# Patient Record
Sex: Female | Born: 1955 | Race: Black or African American | Hispanic: No | Marital: Single | Smoking: Never smoker
Health system: Southern US, Community
[De-identification: ages and names within clinical notes are randomized; demographics above are authoritative.]

## PROBLEM LIST (undated history)

## (undated) DIAGNOSIS — D649 Anemia, unspecified: Secondary | ICD-10-CM

## (undated) DIAGNOSIS — E119 Type 2 diabetes mellitus without complications: Secondary | ICD-10-CM

## (undated) DIAGNOSIS — M1990 Unspecified osteoarthritis, unspecified site: Secondary | ICD-10-CM

## (undated) DIAGNOSIS — I10 Essential (primary) hypertension: Secondary | ICD-10-CM

## (undated) HISTORY — DX: Type 2 diabetes mellitus without complications: E11.9

## (undated) HISTORY — DX: Unspecified osteoarthritis, unspecified site: M19.90

## (undated) HISTORY — PX: TUBAL LIGATION: SHX77

---

## 1994-05-01 HISTORY — PX: DILATION AND CURETTAGE OF UTERUS: SHX78

## 2001-06-06 ENCOUNTER — Other Ambulatory Visit: Admission: RE | Admit: 2001-06-06 | Discharge: 2001-06-06 | Payer: Self-pay | Admitting: *Deleted

## 2008-07-10 ENCOUNTER — Observation Stay (HOSPITAL_COMMUNITY): Admission: EM | Admit: 2008-07-10 | Discharge: 2008-07-10 | Payer: Self-pay | Admitting: Emergency Medicine

## 2008-07-24 ENCOUNTER — Ambulatory Visit: Payer: Self-pay | Admitting: Obstetrics & Gynecology

## 2008-07-24 ENCOUNTER — Encounter: Payer: Self-pay | Admitting: Obstetrics and Gynecology

## 2008-07-24 LAB — CONVERTED CEMR LAB
FSH: 8.1 milliintl units/mL
Hemoglobin: 9 g/dL — ABNORMAL LOW (ref 12.0–15.0)
RBC: 5.02 M/uL (ref 3.87–5.11)
RDW: 30.5 % — ABNORMAL HIGH (ref 11.5–15.5)

## 2008-07-28 ENCOUNTER — Encounter: Admission: RE | Admit: 2008-07-28 | Discharge: 2008-07-28 | Payer: Self-pay | Admitting: Family Medicine

## 2008-07-29 ENCOUNTER — Ambulatory Visit (HOSPITAL_COMMUNITY): Admission: RE | Admit: 2008-07-29 | Discharge: 2008-07-29 | Payer: Self-pay | Admitting: Family Medicine

## 2008-09-04 ENCOUNTER — Encounter: Payer: Self-pay | Admitting: Obstetrics & Gynecology

## 2008-09-04 ENCOUNTER — Ambulatory Visit: Payer: Self-pay | Admitting: Obstetrics & Gynecology

## 2008-09-04 LAB — CONVERTED CEMR LAB: TSH: 1.928 microintl units/mL (ref 0.350–4.500)

## 2008-10-04 ENCOUNTER — Ambulatory Visit: Payer: Self-pay | Admitting: Obstetrics & Gynecology

## 2008-10-06 ENCOUNTER — Ambulatory Visit (HOSPITAL_COMMUNITY): Admission: RE | Admit: 2008-10-06 | Discharge: 2008-10-06 | Payer: Self-pay | Admitting: Obstetrics & Gynecology

## 2008-10-06 ENCOUNTER — Encounter: Payer: Self-pay | Admitting: Obstetrics & Gynecology

## 2008-11-06 ENCOUNTER — Ambulatory Visit: Payer: Self-pay | Admitting: Obstetrics & Gynecology

## 2008-11-06 ENCOUNTER — Encounter: Payer: Self-pay | Admitting: Obstetrics & Gynecology

## 2008-11-06 LAB — CONVERTED CEMR LAB: Yeast Wet Prep HPF POC: NONE SEEN

## 2010-03-22 IMAGING — CT CT ABDOMEN W/ CM
2 of 5 series · 15 of 46 positions shown, 17 images · IV contrast (APPLIED)
Comparison: None.

CT ABDOMEN

CLINICAL DATA: 52-year-old female with fatigue, weakness, anemia.

CT ABDOMEN AND PELVIS WITH CONTRAST
TECHNIQUE: Multidetector CT imaging of the abdomen and pelvis was
performed using the standard protocol following bolus
administration of intravenous contrast.
Contrast: 100 ml Zmnipaque-JZZ.

[Series 3: abd/pelv with 5.0 b31f st · axial · 0.76mm/px · z∈[+922,+1282]mm · 12 of 82 slices shown, 14 images]
[im 5/82  soft-tissue]
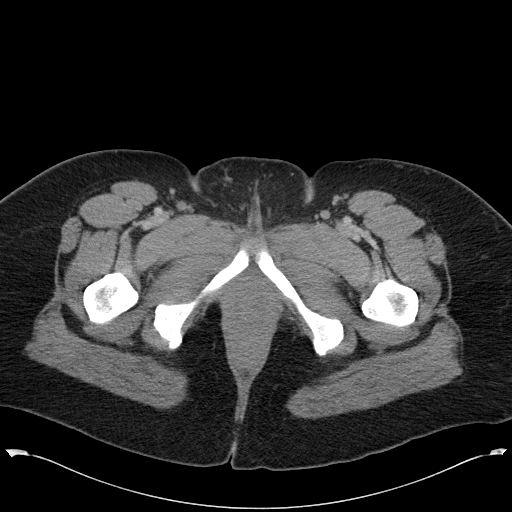
[im 5/82  bone]
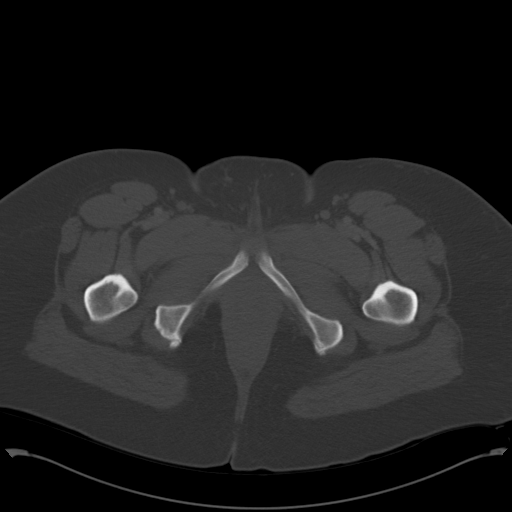
[im 14/82  soft-tissue]
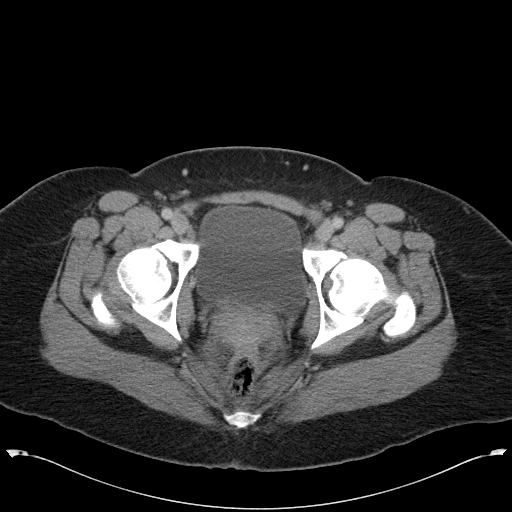
[im 19/82  soft-tissue]
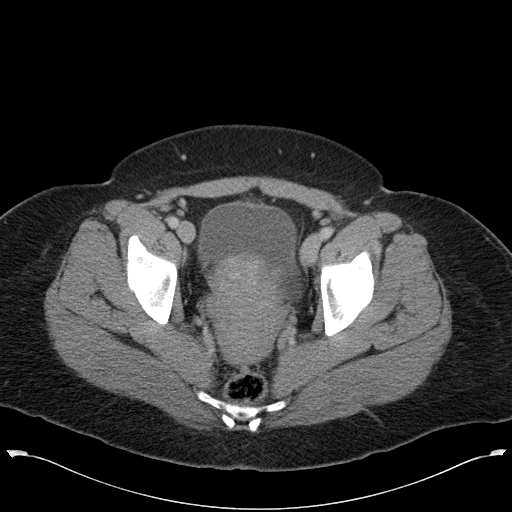
[im 23/82  soft-tissue]
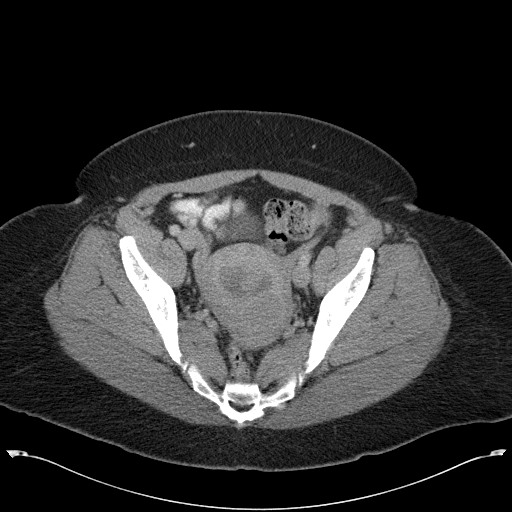
[im 32/82  soft-tissue]
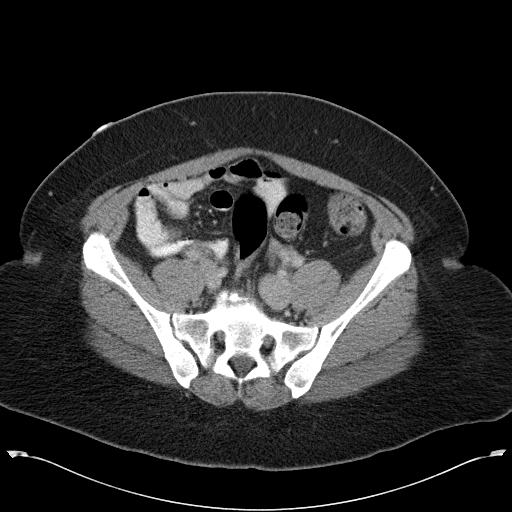
[im 37/82  soft-tissue]
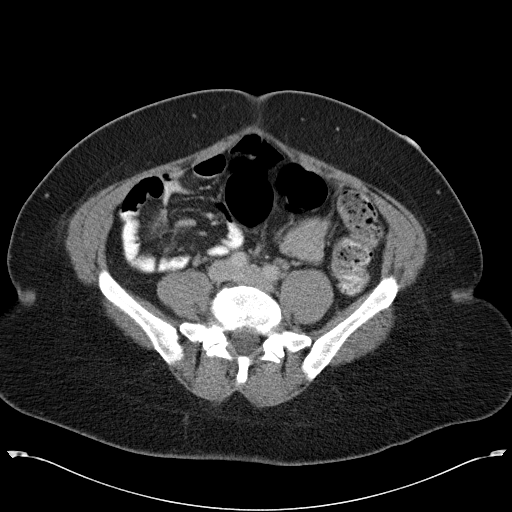
[im 46/82  soft-tissue]
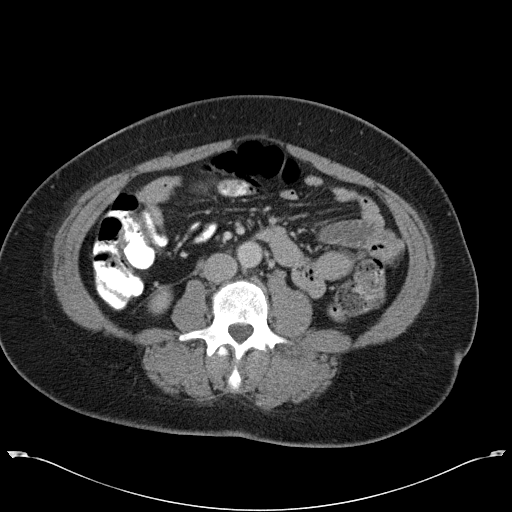
[im 50/82  soft-tissue]
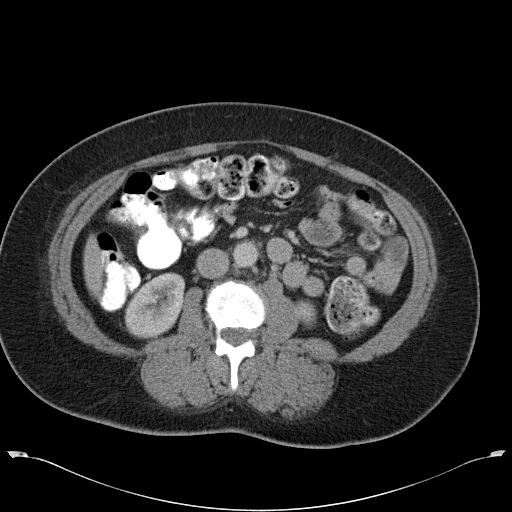
[im 59/82  soft-tissue]
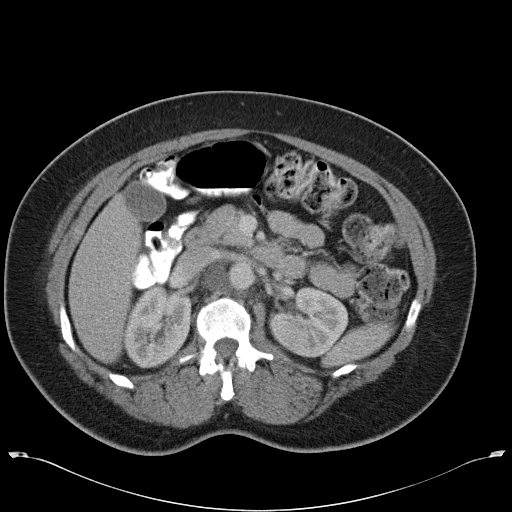
[im 59/82  bone]
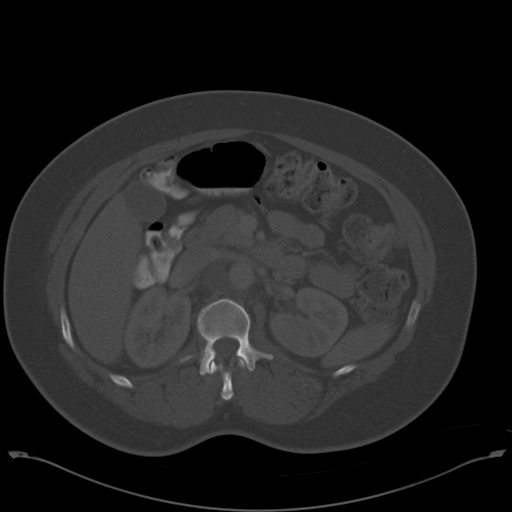
[im 64/82  soft-tissue]
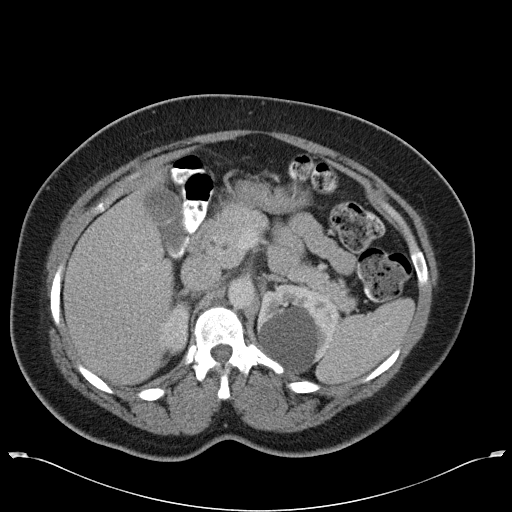
[im 68/82  soft-tissue]
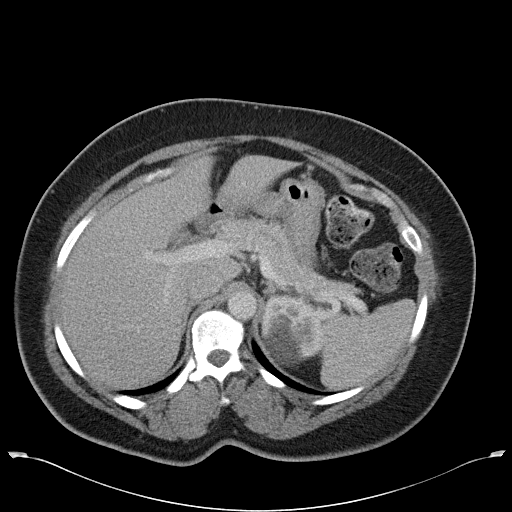
[im 77/82  soft-tissue]
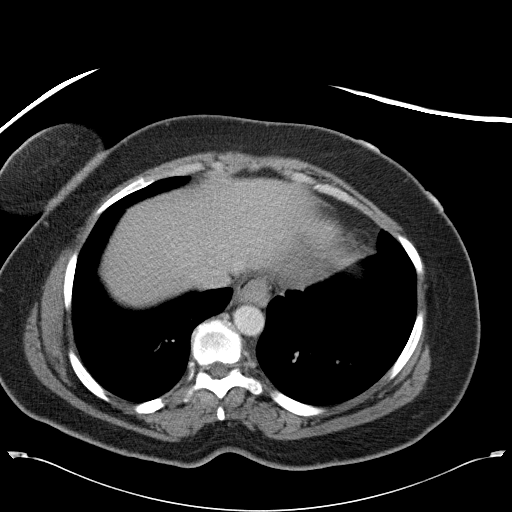

[Series 602: coronal · coronal · 0.80mm/px · 3 of 83 slices shown]
[im 28/83  soft-tissue]
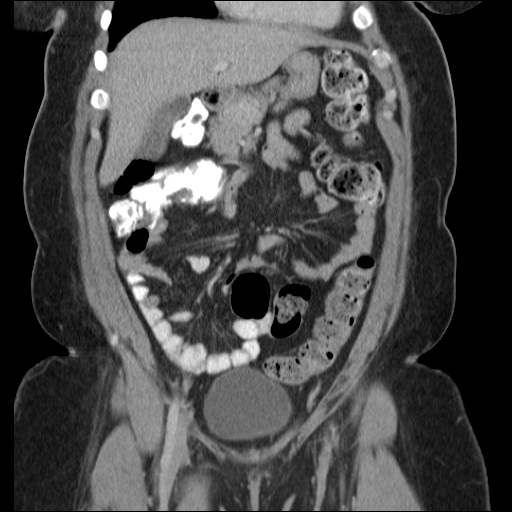
[im 37/83  soft-tissue]
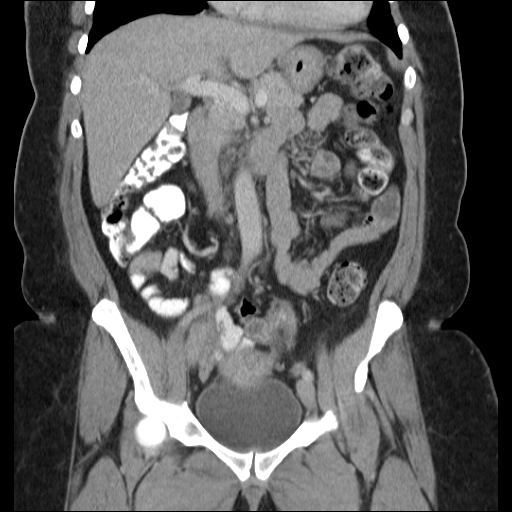
[im 46/83  soft-tissue]
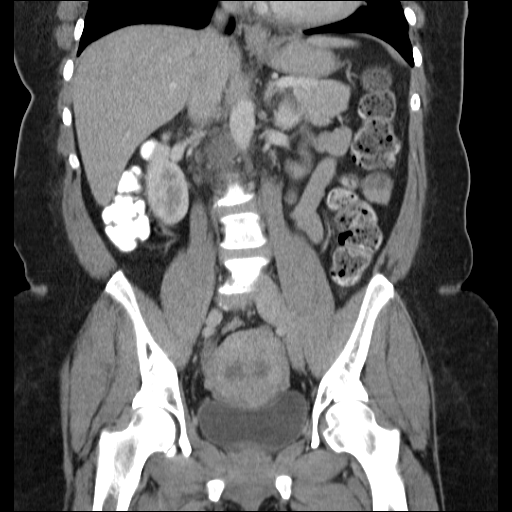

[15 of 46 positions shown; findings below may reference images not displayed]

FINDINGS: Minor atelectasis at the lung bases.  Scoliosis.  L5-S1
chronic disc degeneration. No acute osseous abnormality identified.
No pleural or pericardial effusion.  The liver, gallbladder,
spleen, pancreas, adrenal glands, right kidney, stomach, duodenum,
and proximal small bowel loops are within normal limits.  The left
kidney is normal aside from a large low density lesion measuring 5
cm in diameter involving the upper pole, but with densitometry most
compatible with a simple cyst.  The portal venous system is within
normal limits.  The major abdominal arterial structures are within
normal limits (incidental duplicated main right renal artery), but
there is retroperitoneal lymphadenopathy at the level of the main
right renal artery which elevates the inferior vena cava.
Individual nodes which appear necrotic measure up to 19 mm in short
axis.  No other lymphadenopathy identified in the abdomen.  No
associated paraspinal inflammatory changes at that level.  No
dilated bowel.  Visualized distal small bowel loops are within
normal limits.  The cecum is on a lax mesentery and is rotated into
the right upper quadrant.  The appendix and terminal ileum are
normal.  The remaining colon in the abdomen is within normal limits
aside from retained stool throughout.
IMPRESSION: 1.  Necrotic appearing, isolated retroperitoneal lymphadenopathy at
the level of the right renal artery.  Individual nodes measure up
to 19 mm in short axis.  No associated inflammatory changes or
suspicious masses in the adjacent viscera.  Necrotic nodes are most
often seen in the setting of infection (including atypical and
mycobacterial/TB) and metastatic disease.
2.  Lax cecal mesentery with rotation of the cecum in the right
upper quadrant, likely incidental.
3.  Simple appearing left renal cyst measuring 5 mm in size.
4.  See pelvis findings below.

CT PELVIS
FINDINGS: No pelvic free fluid.  The uterus is enlarged, and there
is a subtle roughly 4 cm mass along the dorsal myometrium.  There
is also subtle increased density in the region of the endometrial
canal at the fundus.  The right adnexa is within normal limits.
The left adnexa appears to be located superior to the uterine
fundus and is remarkable for a 16 mm diameter cystic lesion with
surrounding enhancement.  No pelvic sidewall lymphadenopathy.
Major pelvic arterial structures are within normal limits.
Redundant sigmoid colon extends into the abdomen.  Otherwise,
visualized distal large and small bowel are within normal limits.
The bladder is unremarkable. No acute osseous abnormality
identified.
IMPRESSION: 1.  Uterine enlargement which may be related to a dorsal intramural
fibroid measuring roughly 4 cm in diameter.  Indistinct
hyperdensity in the endometrial canal at the level of the fundus,
endometrial carcinoma not excluded.
2.  Left adnexal cystic lesion measuring 16 mm.  The left ovary
appears located cephalad to the uterine fundus.
3.  Pelvic ultrasound may characterize the abnormalities in #1 and
#2 further.
4.  Redundant sigmoid colon.

## 2010-05-22 ENCOUNTER — Encounter: Payer: Self-pay | Admitting: Family Medicine

## 2010-08-08 LAB — BASIC METABOLIC PANEL
BUN: 8 mg/dL (ref 6–23)
Chloride: 104 mEq/L (ref 96–112)
Creatinine, Ser: 0.72 mg/dL (ref 0.4–1.2)
Glucose, Bld: 77 mg/dL (ref 70–99)
Potassium: 4.1 mEq/L (ref 3.5–5.1)

## 2010-08-08 LAB — CBC
HCT: 39.5 % (ref 36.0–46.0)
MCHC: 32.4 g/dL (ref 30.0–36.0)
MCV: 75.4 fL — ABNORMAL LOW (ref 78.0–100.0)
Platelets: 240 10*3/uL (ref 150–400)
RDW: 18.4 % — ABNORMAL HIGH (ref 11.5–15.5)
WBC: 4.3 10*3/uL (ref 4.0–10.5)

## 2010-08-09 LAB — GLUCOSE, CAPILLARY: Glucose-Capillary: 92 mg/dL (ref 70–99)

## 2010-08-11 LAB — TSH: TSH: 2.806 u[IU]/mL (ref 0.350–4.500)

## 2010-08-11 LAB — COMPREHENSIVE METABOLIC PANEL
ALT: 55 U/L — ABNORMAL HIGH (ref 0–35)
AST: 68 U/L — ABNORMAL HIGH (ref 0–37)
Albumin: 3.7 g/dL (ref 3.5–5.2)
Alkaline Phosphatase: 61 U/L (ref 39–117)
CO2: 26 mEq/L (ref 19–32)
Chloride: 107 mEq/L (ref 96–112)
GFR calc Af Amer: >60 mL/min (ref >60)
GFR calc non Af Amer: >60 mL/min (ref >60)
Potassium: 3.5 mEq/L (ref 3.5–5.1)
Total Bilirubin: 0.5 mg/dL (ref 0.3–1.2)

## 2010-08-11 LAB — URINALYSIS, ROUTINE W REFLEX MICROSCOPIC
Glucose, UA: NEGATIVE mg/dL
Hgb urine dipstick: NEGATIVE
Ketones, ur: NEGATIVE mg/dL
Protein, ur: NEGATIVE mg/dL
Urobilinogen, UA: 1 mg/dL (ref 0.0–1.0)

## 2010-08-11 LAB — IRON AND TIBC
Saturation Ratios: 4 % — ABNORMAL LOW (ref 20–55)
TIBC: 414 ug/dL (ref 250–470)
UIBC: 397 ug/dL

## 2010-08-11 LAB — DIFFERENTIAL
Band Neutrophils: 0 % (ref 0–10)
Blasts: 0 %
Lymphocytes Relative: 34 % (ref 12–46)
Lymphs Abs: 2 10*3/uL (ref 0.7–4.0)
Monocytes Absolute: 0.3 10*3/uL (ref 0.1–1.0)
Monocytes Relative: 5 % (ref 3–12)
Neutrophils Relative %: 59 % (ref 43–77)
Promyelocytes Absolute: 0 %
nRBC: 0 /100 WBC

## 2010-08-11 LAB — URINE CULTURE: Colony Count: >=100000

## 2010-08-11 LAB — LIPID PANEL
Cholesterol: 169 mg/dL (ref 0–200)
HDL: 32 mg/dL — ABNORMAL LOW (ref >39)
LDL Cholesterol: 118 mg/dL — ABNORMAL HIGH (ref 0–99)

## 2010-08-11 LAB — POCT I-STAT, CHEM 8
Calcium, Ion: 0.98 mmol/L — ABNORMAL LOW (ref 1.12–1.32)
Chloride: 108 mEq/L (ref 96–112)
Glucose, Bld: 101 mg/dL — ABNORMAL HIGH (ref 70–99)
HCT: 27 % — ABNORMAL LOW (ref 36.0–46.0)
Hemoglobin: 9.2 g/dL — ABNORMAL LOW (ref 12.0–15.0)
TCO2: 22 mmol/L (ref 0–100)

## 2010-08-11 LAB — CROSSMATCH

## 2010-08-11 LAB — HEMOGLOBIN A1C: Hgb A1c MFr Bld: 6 % (ref 4.6–6.1)

## 2010-08-11 LAB — CBC
HCT: 24.8 % — ABNORMAL LOW (ref 36.0–46.0)
Platelets: 309 10*3/uL (ref 150–400)
RDW: 23.8 % — ABNORMAL HIGH (ref 11.5–15.5)
WBC: 5.8 10*3/uL (ref 4.0–10.5)

## 2010-08-11 LAB — FERRITIN: Ferritin: 2 ng/mL — ABNORMAL LOW (ref 10–291)

## 2010-08-11 LAB — HEMOGLOBIN AND HEMATOCRIT, BLOOD: HCT: 28.3 % — ABNORMAL LOW (ref 36.0–46.0)

## 2010-08-11 LAB — PROTIME-INR: Prothrombin Time: 12.3 seconds (ref 11.6–15.2)

## 2010-08-11 LAB — ABO/RH: ABO/RH(D): O POS

## 2010-08-11 LAB — FOLATE: Folate: >20 ng/mL

## 2010-08-11 LAB — HEMOCCULT GUIAC POC 1CARD (OFFICE): Fecal Occult Bld: NEGATIVE

## 2010-09-13 NOTE — Op Note (Signed)
[name], [name]               ACCOUNT NO.:  [id number]   MEDICAL RECORD NO.:  [id number]          PATIENT TYPE:  WOC   LOCATION:  WOC                          FACILITY:  [facility]   PHYSICIAN:  Allie Bossier, MD        DATE OF BIRTH:  [birth year]   DATE OF PROCEDURE:  10/06/2008  DATE OF DISCHARGE:                               OPERATIVE REPORT   PREOPERATIVE DIAGNOSES:  Dysfunctional uterine bleeding, uterine  fibroids, uterine polyps, obesity, hypertension.   POSTOPERATIVE DIAGNOSES:  Dysfunctional uterine bleeding, uterine  fibroids, uterine polyps, obesity, hypertension.   PROCEDURE:  Hysteroscopy, dilation and curettage, and ThermaChoice  endometrial ablation.   SURGEON:  Allie Bossier, MD   ANESTHESIA:  LMA.  Malen Gauze, MD   COMPLICATIONS:  None.   ESTIMATED BLOOD LOSS:  Minimal.   SPECIMENS:  Uterine curettings including polyps.   DETAILS OF PROCEDURE AND FINDINGS:  The risks, benefits, and  alternatives of surgery were explained, understood, and accepted.  Consents were signed.  All questions were answered.  She was taken to  the operating room.  General anesthesia was applied without  complication.  She was placed in the dorsal lithotomy position.  Her  vagina was prepped and draped in the usual sterile fashion.  Her bladder  was emptied with a Robinson catheter.  Bimanual exam revealed a lumpy 8  weeks size mobile uterus and nonenlarged adnexa.  A weighted speculum  was placed posteriorly and a Deaver anteriorly.  The anterior lip of the  cervix was grasped with single-tooth tenaculum.  Cervix was then gently  and easily dilated to accommodate a diagnostic hysteroscope.  Hysteroscopy was performed.  There was a thickened endometrium noted  throughout the several polypoid areas.  Sharp curettage was done and a  large amount of endometrium was obtained.  A gritty sensation was  appreciated throughout and in the fundus of the uterus on hysteroscopy  confirmed that the  uterine cavity was emptied.  I then proceeded with  the ThermaChoice ablation after the proper tori steps per the protocol.  The uterus was sounded to 9 cm.  The ThermaChoice instrument was placed  in the uterine cavity and the balloon was filled using 30 mL of  solution.  The pressure stabilized at 150 and the cycle was started, 8-  minute cycle completed and the balloon was removed and noted to be  intact.  No  bleeding from the tenaculum or the cervical os.  Net loss of the  distention medium was 10 mL.  She tolerated the procedure well.  She was  taken to the recovery room in stable condition.  The instruments,  sponge, and needle counts were correct.      Allie Bossier, MD  Electronically Signed     MCD/MEDQ  D:  10/06/2008  T:  10/07/2008  Job:  [id number]

## 2010-09-13 NOTE — Group Therapy Note (Signed)
NAMEGAYLIA, [name] NO.:  [id number]   MEDICAL RECORD NO.:  [id number]          PATIENT TYPE:  WOC   LOCATION:  [facility]                   FACILITY:  [facility]   PHYSICIAN:  Allie Bossier, MD        DATE OF BIRTH:  Mar 19, 1956   DATE OF SERVICE:  09/04/2008                                  CLINIC NOTE   [name] is a 55 year old single black gravida 2, para 2 with 2 grown  children.  She comes in here as a follow up from her visit 3 weeks ago.  Initially, she was seen at her internal medicine doctor because of  menorrhagia.  At that time, her hemoglobin was checked and noted to be  7.4.  She was admitted to the hospital and transfused 2 units of blood.  During that day, she had a GYN ultrasound that showed normal adnexa, but  several fibroids one of which measures 4.1 x 3.9 cm.  There were also  thickened areas in the endometrium that were suspicious for polyp.  In  the interim, she was prescribed estradiol and Provera.  However, she did  not take the Provera and only took the estradiol.  However, her bleeding  has stopped since her visit 3 weeks ago and she is ready for her Pap  smear today.   PAST MEDICAL HISTORY:  Fibroid uterus, hypertension, obesity, anemia,  and menometrorrhagia.   PAST SURGICAL HISTORY:  Tubal ligation.   SOCIAL HISTORY:  Negative for tobacco, alcohol, or drug use.   FAMILY HISTORY:  Negative for breast, GYN, and colon malignancies.   ALLERGIES:  No known drug allergies.  No to latex allergies.   REVIEW OF SYSTEMS:  She has been monogamous for the last 6 years.   MEDICATIONS:  1. Iron supposed to be taken 3 times a day which she does not quite do      that.  2. Vitamin C 500 mg daily.  3. Lisinopril 20 mg daily.  4. Claritin daily.  5. Estradiol 1 mg daily (please note, I have advised her not to take      unopposed estrogen and I told her just to discontinue the estradiol      prior to surgery).   PHYSICAL EXAMINATION:  VITAL  SIGNS:  Weight 200 pounds, height 5 feet 2  inches, blood pressure 146/79, pulse 74.  HEENT:  Normal.  HEART:  Regular rate and rhythm.  LUNGS:  Clear to auscultation bilaterally.  ABDOMEN:  Centripetal obesity.  No palpable hepatosplenomegaly.  EXTERNAL GENITALIA:  No lesions.  Cervix, no lesions and no bleeding  today.  Pap smear was obtained.  Bimanual exam reveals a 8-week size  uterus.  The adnexa are not palpable.  Please note that this exam is  limited by her centripetal obesity.   ASSESSMENT AND PLAN:  1. Menometrorrhagia resulting in anemia requiring transfusions.      Polyps and fibroids are present.  I have suggested that we do a D      and C, hysteroscopy, and resection of the polyps followed      immediately by  an endometrial ablation with the ThermaChoice      instrument.  In the meantime, I have done a Pap smear today and      ordered a TSH.  Please note that, I did see acanthosis nigricans on      her inner thighs and I am ordering a random glucose today due to      that finding along with her obesity.  Paperwork is being turned in      today for her surgery.  Risks, benefits, and alternatives of      surgery explained to her and accepted.      Allie Bossier, MD     MCD/MEDQ  D:  09/04/2008  T:  09/04/2008  Job:  [id number]

## 2010-09-13 NOTE — Group Therapy Note (Signed)
NAMESAMAR, VENNEMAN NO.:  [id number]   MEDICAL RECORD NO.:  [id number]          PATIENT TYPE:  WOC   LOCATION:  [facility]                   FACILITY:  [facility]   PHYSICIAN:  Allie Bossier, MD        DATE OF BIRTH:  14-Jul-1955   DATE OF SERVICE:  09/04/2008                                  CLINIC NOTE   [name] is a 55 year old single.  She is single lady who has been  having monthly periods lasting 7 days of very, very heavy periods for  several years.  In fact, she was admitted to the hospital on July 09, 2008 due to significant anemia (her hemoglobin was 7.41 when she showed  up in the doctor's office complaining of heavy periods).  She had an  ultrasound and CT of the abdomen and pelvis which showed several uterine  fibroids, one of which measures 4 cm and come possible endometrial  polyp.      Allie Bossier, MD     MCD/MEDQ  D:  09/04/2008  T:  09/04/2008  Job:  [id number]

## 2010-09-13 NOTE — Group Therapy Note (Signed)
[name], [name] NO.:  [id number]   MEDICAL RECORD NO.:  [id number]          PATIENT TYPE:  WOC   LOCATION:  [facility]                   FACILITY:  [facility]   PHYSICIAN:  Scheryl Darter, MD       DATE OF BIRTH:  [birth year]   DATE OF SERVICE:  07/24/2008                                  CLINIC NOTE   The patient was referred here from her primary care physician for  followup of menorrhagia.  She states that she has had this heavy  bleeding, abnormal periods since her early 47s.  She was recently  hospitalized July 09, 2008, for hemoglobin in the office found to be  7.2.  She was transfused in the hospital and posttransfusion hemoglobin  at that time was 8.4.  Today, she denies any symptoms.  No shortness of  breath, no dizziness, no vision changes, no chest pain.  No syncopal  episodes.  She is currently undergoing her period right now and states  that it is heavy and that she is having some cramping and some abdominal  pain with it.  She is a nonsmoker and she is not on any medications as  far as birth control pills go.  She also states that she is currently  undergoing menopausal symptoms since she was 55 years old, complaining  of hot flashes and also some night sweats.   PHYSICAL EXAMINATION:  VITAL SIGNS:  Temperature was 97; pulse 55; blood  pressure 169/89, rechecked it was 177/83.  Her weight is 200.8 pounds.  She is 5 feet 2 inches.  GENERAL:  She is mildly obese, pleasant, and in no apparent distress.  CARDIOVASCULAR:  Regular rate and rhythm.  No murmurs, rubs, or gallops.  RESPIRATORY:  Clear to auscultation bilaterally.  ABDOMEN:  Soft, nontender, and mildly obese.  No masses palpable.  PELVIC:  The patient declined pelvic exam due to current bleeding.  She  also declined speculum exam.  EXTREMITIES:  No edema.   ASSESSMENT AND PLAN:  This is a 55 year old female with menorrhagia and  fibroids seen on a CT scan done on July 10, 2008.  Plan for  her, given  her symptoms and heavy bleeding:  1. Menorrhagia.  We will obtain a transvaginal ultrasound.  We will      check FSH given the fact that she states that she is undergoing      menopause.  We will also check CBC today to look for changes in her      hemoglobin.  We will start her on Provera 5 mg and also start on      estradiol 1 mg.  She has not had a Pap smear since 1996.  We will      schedule to perform that and follow up this in 2-3 weeks.  She has      a mammogram scheduled for July 28, 2008.  2. Anemia.  Recommend her to continue the ferrous sulfate 325 t.i.d.      We will check CBC today.  3. Hypertension.  Blood pressure is not adequately controlled at this  time.  Today, we added combination p.o.      hydrochlorothiazide and lisinopril 12.5/20.  She is to take this      daily for the next month and be reevaluated by her primary care      physician who can determine whether to increase the dosage.     ______________________________  Nancy Shingles, MD    ______________________________  Scheryl Darter, MD    KL/MEDQ  D:  07/24/2008  T:  07/25/2008  Job:  [id number]   cc:   Ursula Beath, MD

## 2010-09-13 NOTE — H&P (Signed)
NAMEMARIAM, [name] NO.:  [id number]   MEDICAL RECORD NO.:  [id number]          PATIENT TYPE:  EMS   LOCATION:  [facility]                         FACILITY:  [facility]   PHYSICIAN:  Vania Rea, M.D. DATE OF BIRTH:  19-Sep-1955   DATE OF ADMISSION:  07/09/2008  DATE OF DISCHARGE:                              HISTORY & PHYSICAL   PRIORITY ADMISSION HISTORY AND PHYSICAL   PRIMARY CARE PHYSICIAN:  Nancy Rowan, MD.   CHIEF COMPLAINT:  Anemia.   HISTORY OF PRESENT ILLNESS:  This is a 55 year old African American  lady, who has not had a medical followup for some time, who does suffer  with menorrhagia and says sometimes she has to double up on her pads and  menstruation can last from 3 to 5 days but are regular.  She has never  been investigated for a cause of her menorrhagia.  Eventually besides  that she has turned 52, she has a regular medical checkup including make  arrangements for her preventative screening.  The patient saw Dr.  Maryelizabeth Carroll for the first time 3 days ago, had some blood work done,  and was called yesterday and told to present to the emergency room  because of severe anemia.  The patient came to the [facility]  Room and had blood work done, which showed a severe microcytic anemia,  and the Hospitalist Service is called to assist with management.   The patient denies any chest pains or shortness of breath.  She denies  any dyspnea on exertion and says she can walk miles easily without  getting short of breath.  She has not been losing any weight or gaining  weight.  She has no headaches, no orthopnea, no fever, cough, or cold,  no nausea, vomiting, nor diarrhea, no lower extremity edema.   PAST MEDICAL HISTORY:  She has no significant medical problems.   FAMILY HISTORY:  Significant for her mother with hypertension and  diabetes, and multiple siblings with hypertension.   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  She has 2  teenage children.   MEDICATIONS:  None.   ALLERGIES:  No known drug allergies.   REVIEW OF SYSTEMS:  Other than noted above, 10-point review of systems  is negative.   PHYSICAL EXAMINATION:  GENERAL:  A somewhat obese, middle-aged, African  American lady reclining on the stretcher in no acute distress.  VITAL SIGNS:  Her temperature is 97.8, pulse 71, respiration of 20,  blood pressure 191/97, she is saturating at 100% on 2 L.  HEENT:  Pupils are round and equal, mucous membranes pale, anicteric.  NECK:  No cervical lymphadenopathy or thyromegaly, no jugulovenous  distention.  CHEST:  Clear to auscultation bilaterally.  CARDIOVASCULAR SYSTEM:  Regular rhythm without murmur.  ABDOMEN:  Obese, but soft.  She says she is tender in the abdomen.  EXTREMITIES:  Without edema.  She has 2+ dorsalis pedis pulses  bilaterally.  CENTRAL NERVOUS SYSTEM:  Cranial nerves II-XII  are grossly intact, and  she has no focal neurologic deficit.   LABORATORY DATA:  Significant for a white count of 5.8, hemoglobin is  7.4, MCV 58.3, platelets 379.  Complete metabolic panel is completely  normal.  Her BUN is 9, her creatinine is undetectable.  Two-view chest x-  ray shows scoliosis at multiple thoracic levels, but otherwise no acute  abnormalities.  PT and INR are normal.  Stool occult blood is negative.   ASSESSMENT:  1. Severe microcytic anemia probably related to menorrhagia.  2. Nonspecific abdominal pain.  3. Mild obesity.  4. Uncontrolled hypertension.   PLAN:  We will bring this lady in on observation for transfusion and  start some antihypertensives, and she can probably get further workup  for her anemia as an outpatient.  Other plans as per orders.      Vania Rea, M.D.  Electronically Signed     LC/MEDQ  D:  07/10/2008  T:  07/10/2008  Job:  [id number]   cc:   [name], M.D.

## 2010-09-13 NOTE — Discharge Summary (Signed)
NAMEJALONDA, [name] NO.:  [id number]   MEDICAL RECORD NO.:  [id number]          PATIENT TYPE:  OBV   LOCATION:  5123                         FACILITY:  [facility]   PHYSICIAN:  Charlestine Massed, MDDATE OF BIRTH:  1955/12/03   DATE OF ADMISSION:  07/09/2008  DATE OF DISCHARGE:  07/10/2008                               DISCHARGE SUMMARY   PRIMARY CARE PHYSICIAN:  Maryelizabeth Rowan, M.D.   GYNECOLOGIST:  Scheryl Darter, M.D. at [facility]   REASON FOR ADMISSION:  Anemia, sent from doctor's office.   DISCHARGE DIAGNOSES:  1. Blood loss anemia secondary to heavy menorrhagia.  2. Mild obesity.  3. Possible high blood pressure while in the hospital to be followed      up at PMD's office.   DISCHARGE MEDICATIONS:  1. Ferrous sulfate 325 mg p.o. t.i.d.  2. Colace 300 mg p.o. q.h.s.  3. Lisinopril 20 mg p.o. daily.   HOSPITAL COURSE:  1. Anemia of blood loss.  A 55 year old female [name] was sent      by the PMD's office as she was seen there for a regular checkup and      blood tests showed anemia of 7.4.  She was seen in the emergency      room and she was found to have microcytic hypochromic anemia.      History wise she stated that she has heavy menorrhagia requiring      more than 15-20 pads a day which are thoroughly soaked in blood.      So the patient was admitted with anemia of blood loss and given 1      unit of blood transfusion.  A CAT scan of the abdomen and pelvis      was done which reveals uterine enlargement with a dorsal intramural      fibroid measuring 4 cm in diameter and indistinct hyperdensity in      the endometrial canal at the level of the fundus and that      malignancy cannot be excluded.  Also has adnexal cystic lesion 60      mm on the left ovary located cephalad to the uterine fundus.  These      findings were discussed with Dr. Debroah Loop at the [facility] who stated that there is no urgent  consult needed by      gynecology but the patient can follow up with gyn as an outpatient      and an appointment has been set up for the patient to follow up      with gyn as outpatient on March 26 with Dr. Debroah Loop.  The patient's      repeat hemoglobin was 8.7 and the patient is asymptomatic all this      time.  The patient is being discharged back home on the      medications.  2. Hypertension.  Has been having high blood pressure.  I told her      that there could be slightly high blood pressure with anemia and  currently will start the patient on lisinopril 20 mg p.o. daily and      further adjustment can be done by PMD at the office.   DISCHARGE INSTRUCTIONS:  1. To follow up with PMD's office in 1 week.  2. If she develops excessive bleeding to contact her primary doctor      immediately.   FOLLOWUP:  1. Follow up with the PMD's office in 1 week.  2. Follow up with gyn on March 26.      Charlestine Massed, MD  Electronically Signed     UT/MEDQ  D:  07/10/2008  T:  07/10/2008  Job:  [id number]   cc:   Maryelizabeth Rowan, M.D.  Scheryl Darter, MD

## 2010-09-13 NOTE — Group Therapy Note (Signed)
[name], [name] NO.:  [id number]   MEDICAL RECORD NO.:  [id number]          PATIENT TYPE:  WOC   LOCATION[facility]  [facility]                   FACILITY:  [facility]   PHYSICIAN:  Elsie Lincoln, MD      DATE OF BIRTH:  Oct 12, 1955   DATE OF SERVICE:  11/06/2008                                  CLINIC NOTE   This is a 55 year old female, who presents for post surgical followup.  The patient had a hysteroscopy D and C, ThermaChoice endometrial  ablation with Dr. Nicholaus Bloom on October 06, 2008.  The patient has been  having some discharge and cramping since.  Also of note, the patient was  recently treated for Trichomonas, so we screened for that today.  We  discussed the procedure.  She did have negative pathology with some  endometrial polyp.  She has not had any bleeding.  She is exercising  some and that can explain some of her cramping.  The patient is overall  satisfied with her procedure.  She is up-to-date on her Pap smear and  her mammogram.   PHYSICAL EXAMINATION:  VITAL SIGNS:  Temperature 98.3, pulse 60, blood  pressure 146/77, weight 199.9.  GENERAL:  Well-nourished, well-developed, in no apparent distress.  HEENT:  Normocephalic, atraumatic.  CHEST:  Normal movement.  ABDOMEN:  Obese, soft, nontender.  GENITALIA:  Tanner V.  Vagina pink, normal rugae, and no abnormal  discharge.  Cervix closed, nontender.  Uterus enlarged, nontender.  EXTREMITIES:  Nontender.   ASSESSMENT AND PLAN:  A 54 year old female, status post dilation and  curettage, hysteroscopy, polypectomy, and endometrial ablation.  1. Test cure for Trichomonas.  2. Up-to-date on all health care maintenance.  3. Return to clinic in May for Pap smear or orally if they are      problems.           ______________________________  Elsie Lincoln, MD     KL/MEDQ  D:  11/06/2008  T:  11/07/2008  Job:  [id number]

## 2011-04-19 ENCOUNTER — Encounter: Payer: Self-pay | Admitting: *Deleted

## 2011-04-19 ENCOUNTER — Emergency Department (HOSPITAL_COMMUNITY)
Admission: EM | Admit: 2011-04-19 | Discharge: 2011-04-19 | Disposition: A | Discharge status: Home / Self Care | Payer: Medicaid Other | Attending: Emergency Medicine | Admitting: Emergency Medicine

## 2011-04-19 DIAGNOSIS — I10 Essential (primary) hypertension: Secondary | ICD-10-CM | POA: Insufficient documentation

## 2011-04-19 DIAGNOSIS — R040 Epistaxis: Secondary | ICD-10-CM | POA: Insufficient documentation

## 2011-04-19 HISTORY — DX: Essential (primary) hypertension: I10

## 2011-04-19 MED ORDER — ONDANSETRON 8 MG PO TBDP
8.0000 mg | ORAL_TABLET | Freq: Once | ORAL | Status: AC
  Administered 2011-04-19: 8 mg via ORAL
  Filled 2011-04-19: qty 1

## 2011-04-19 NOTE — ED Provider Notes (Signed)
Medical screening examination/treatment/procedure(s) were performed by non-physician practitioner and as supervising physician I was immediately available for consultation/collaboration.   Forbes Cellar, MD 04/19/11 1759

## 2011-04-19 NOTE — ED Notes (Signed)
Bleeding control at this time

## 2011-04-19 NOTE — ED Notes (Signed)
Pt states new onset low abd pain today, states" i think i have female pain". Pt states she took home preg test end of Nov.

## 2011-04-19 NOTE — ED Notes (Signed)
Per ems: pt had a nose bleed last night and today.

## 2011-04-19 NOTE — ED Provider Notes (Signed)
History     CSN: [id number] Arrival date & time: 04/19/2011  3:32 PM   First MD Initiated Contact with Patient 04/19/11 1715      Chief Complaint  Patient presents with  . Epistaxis    (Consider location/radiation/quality/duration/timing/severity/associated sxs/prior treatment) HPI Comments: Patient with history of nose bleeds presents with 2 nosebleeds over the past 24 hours. Patient states that she has nasal congestion and a cough. She states that she had a episode of bleeding from her left naris last evening which was resolved with pressure. Today while the patient was at the mall she had another episode from the same area that was not resolved with pressure. She came to the emergency department for evaluation. Bleeding resolved upon arrival to the emergency department. The patient denies using any blood thinning medications. She denies any trauma to her nose.  Patient is a 55 y.o. female presenting with nosebleeds. The history is provided by the patient.  Epistaxis  This is a new problem. The current episode started 12 to 24 hours ago. The problem occurs rarely. The problem has been resolved. The problem is associated with an unknown factor. The bleeding has been from the left nare. She has tried applying pressure for the symptoms. The treatment provided significant relief. Her past medical history is significant for colds.    Past Medical History  Diagnosis Date  . Hypertension     Past Surgical History  Procedure Date  . Tubal ligation     History reviewed. No pertinent family history.  History  Substance Use Topics  . Smoking status: Never Smoker   . Smokeless tobacco: Not on file  . Alcohol Use: No    OB History    Grav Para Term Preterm Abortions TAB SAB Ect Mult Living                  Review of Systems  Constitutional: Negative for fever.  HENT: Positive for nosebleeds, congestion and rhinorrhea. Negative for sore throat.   Eyes: Negative for discharge.    Respiratory: Negative for shortness of breath.   Gastrointestinal: Negative for nausea, vomiting, abdominal pain, diarrhea and constipation.  Genitourinary: Negative for dysuria and hematuria.  Musculoskeletal: Negative for myalgias.  Skin: Negative for rash.  Neurological: Negative for headaches.  Psychiatric/Behavioral: Negative for confusion.    Allergies  Review of patient's allergies indicates no known allergies.  Home Medications   Current Outpatient Rx  Name Route Sig Dispense Refill  . HYDROCHLOROTHIAZIDE 25 MG PO TABS Oral Take 25 mg by mouth daily.      Marland Kitchen LISINOPRIL 40 MG PO TABS Oral Take 40 mg by mouth daily.      Marland Kitchen VITAMIN C 500 MG PO TABS Oral Take 500 mg by mouth daily.        BP 137/89  Pulse 70  Temp(Src) 97.8 F (36.6 C) (Oral)  Resp 20  Ht 5\' 1"  (1.549 m)  Wt 202 lb (91.627 kg)  BMI 38.17 kg/m2  SpO2 97%  LMP 04/09/2011  Physical Exam  Nursing note and vitals reviewed. Constitutional: She is oriented to person, place, and time. She appears well-developed and well-nourished.  HENT:  Head: Normocephalic and atraumatic.  Nose: No mucosal edema or rhinorrhea. No epistaxis.  Mouth/Throat: Oropharynx is clear and moist.       Dried blood and irritation to anterior left naris. No bleeding during exam.  Eyes: Pupils are equal, round, and reactive to light. Right eye exhibits no discharge. Left eye exhibits  no discharge.  Neck: Normal range of motion. Neck supple.  Musculoskeletal: Normal range of motion.  Neurological: She is alert and oriented to person, place, and time.  Skin: Skin is warm and dry. No rash noted.  Psychiatric: She has a normal mood and affect.    ED Course  Procedures (including critical care time)  Labs Reviewed - No data to display No results found.   1. Epistaxis    5:56 PM patient seen and examined. Counseled on procedure to follow if nose begins to bleed again. Counseled on use of saline nasal spray to prevent nosebleeds.  Patient verbalized understanding and agrees with plan.   MDM  Patient with epistaxis, bleeding currently resolved spontaneously. No need for packing. Patient is not on any blood thinners.        Eustace Moore [place], [place] 04/19/11 1757

## 2011-04-19 NOTE — ED Notes (Signed)
Pt states she has a cold and cough. Pt states she using a nasal spray and blowing her nose and noticed her nose started to bleed. Bleeding controled at this time

## 2011-04-20 ENCOUNTER — Encounter (HOSPITAL_COMMUNITY): Payer: Self-pay

## 2011-04-20 ENCOUNTER — Emergency Department (HOSPITAL_COMMUNITY)
Admission: EM | Admit: 2011-04-20 | Discharge: 2011-04-20 | Disposition: A | Discharge status: Home / Self Care | Payer: Medicare Other | Attending: Emergency Medicine | Admitting: Emergency Medicine

## 2011-04-20 DIAGNOSIS — R109 Unspecified abdominal pain: Secondary | ICD-10-CM | POA: Insufficient documentation

## 2011-04-20 DIAGNOSIS — I10 Essential (primary) hypertension: Secondary | ICD-10-CM | POA: Insufficient documentation

## 2011-04-20 DIAGNOSIS — R040 Epistaxis: Secondary | ICD-10-CM

## 2011-04-20 DIAGNOSIS — R1030 Lower abdominal pain, unspecified: Secondary | ICD-10-CM

## 2011-04-20 DIAGNOSIS — J069 Acute upper respiratory infection, unspecified: Secondary | ICD-10-CM | POA: Insufficient documentation

## 2011-04-20 DIAGNOSIS — J3489 Other specified disorders of nose and nasal sinuses: Secondary | ICD-10-CM | POA: Insufficient documentation

## 2011-04-20 HISTORY — DX: Anemia, unspecified: D64.9

## 2011-04-20 LAB — URINALYSIS, ROUTINE W REFLEX MICROSCOPIC
Glucose, UA: NEGATIVE mg/dL
Ketones, ur: NEGATIVE mg/dL
Nitrite: NEGATIVE
Protein, ur: NEGATIVE mg/dL

## 2011-04-20 LAB — URINE MICROSCOPIC-ADD ON

## 2011-04-20 MED ORDER — OXYMETAZOLINE HCL 0.05 % NA SOLN
2.0000 | Freq: Once | NASAL | Status: AC
  Administered 2011-04-20: 2 via NASAL
  Filled 2011-04-20: qty 15

## 2011-04-20 NOTE — ED Notes (Signed)
Patient presents with nosebleed to left nare intermittently since last night.  No active bleeding noted.  Patient also requesting a pregnancy test.

## 2011-04-20 NOTE — ED Provider Notes (Signed)
History     CSN: [id number]  Arrival date & time 04/20/11  1236   First MD Initiated Contact with Patient 04/20/11 1247      Chief Complaint  Patient presents with  . Epistaxis    (Consider location/radiation/quality/duration/timing/severity/associated sxs/prior treatment) HPI Patient is a 55 year old female who presents today complaining of epistaxis as well as wanting a pregnancy test. Patient was seen yesterday afternoon to [place] long. Patient did not have active bleeding at that time. She said she has used saline nasal spray as recommended but continues to have intermittent bleeding. She denies chest pain, shortness of breath, lightheadedness, pallor, or any signs of anemia. Patient also is not actively bleeding at this time. She also notes that she's had nasal congestion for about a week that she associates with an upper respiratory tract infection. She denies any cough or fevers. Patient is also concerned that she could be pregnant. She reports her last menstrual period was December 9. She did take a urine pregnancy test at home this morning that she felt was positive. Given her age patient fell that this is very unlikely but wanted to get checked. She has some mild lower abdominal pain that she rates as a 2/10. She admits that had she not had a positive pregnancy test at home she would not have come to the emergency department for evaluation today. She denies any vaginal discharge her concern for sexual transmitted infection. She denies any urinary symptoms either. There are no other associated or modifying factors. Past Medical History  Diagnosis Date  . Hypertension   . Anemia     Past Surgical History  Procedure Date  . Tubal ligation   . Abdominal hysterectomy     History reviewed. No pertinent family history.  History  Substance Use Topics  . Smoking status: Never Smoker   . Smokeless tobacco: Not on file  . Alcohol Use: No    OB History    Grav Para Term Preterm  Abortions TAB SAB Ect Mult Living                  Review of Systems  Constitutional: Negative.   HENT: Positive for nosebleeds, congestion, sneezing and postnasal drip.   Eyes: Negative.   Respiratory: Negative.   Cardiovascular: Negative.   Gastrointestinal: Positive for abdominal pain.  Genitourinary: Negative.   Musculoskeletal: Negative.   Skin: Negative.   Neurological: Negative.   Hematological: Negative.   Psychiatric/Behavioral: Negative.   All other systems reviewed and are negative.    Allergies  Review of patient's allergies indicates no known allergies.  Home Medications   Current Outpatient Rx  Name Route Sig Dispense Refill  . FERROUS SULFATE 325 (65 FE) MG PO TABS Oral Take 325 mg by mouth daily with breakfast.      . HYDROCHLOROTHIAZIDE 25 MG PO TABS Oral Take 25 mg by mouth daily.      Marland Kitchen LISINOPRIL 40 MG PO TABS Oral Take 40 mg by mouth daily.      Marland Kitchen LORATADINE 10 MG PO TABS Oral Take 10 mg by mouth daily as needed. As needed for seasonal allergies.     Marland Kitchen VITAMIN C 500 MG PO TABS Oral Take 500 mg by mouth daily.        BP 145/94  Pulse 71  Temp(Src) 98 F (36.7 C) (Oral)  Resp 16  SpO2 99%  LMP 04/09/2011  Physical Exam  Nursing note and vitals reviewed. Constitutional: She is oriented to person, place,  and time. She appears well-developed and well-nourished. No distress.  HENT:  Head: Normocephalic and atraumatic.  Nose: Mucosal edema present.       Patient has dried blood noted in the left nare.  Eyes: Conjunctivae and EOM are normal. Pupils are equal, round, and reactive to light.  Neck: Normal range of motion.  Cardiovascular: Normal rate and regular rhythm.   Abdominal: Soft. Bowel sounds are normal. She exhibits no distension. There is no tenderness. There is no rebound and no guarding.  Musculoskeletal: Normal range of motion.  Neurological: She is alert and oriented to person, place, and time. No cranial nerve deficit. She exhibits  normal muscle tone. Coordination normal.  Skin: Skin is warm and dry. No rash noted.  Psychiatric: She has a normal mood and affect.    ED Course  Procedures (including critical care time)   Labs Reviewed  POCT PREGNANCY, URINE  URINALYSIS, ROUTINE W REFLEX MICROSCOPIC   No results found.   1. Epistaxis   2. URI, acute   3. Abdominal pain, lower       MDM  Patient was assessed and was not having any active bleeding. I did provide her with Afrin here. We discussed that this could assist with a nasal congestion from her respiratory symptoms. We also discussed holding off on continuing saline for 24 hours wash he uses Afrin. This way she will not wash away the clot tissues formed. Patient was instructed on holding pressure for 30 minutes before releasing this. Patient stated clear understanding. Patient was concerned that she could be pregnant based on a home test she took which appeared to be positive. Patient had negative urine pregnancy test here. Given her lower bowel discomfort urinalysis was performed as well. Pregnancy test urinalysis were negative. Patient remained hemodynamically stable without epistaxis. She felt better after aspirin. She was discharged in good condition to home.        Cyndra Numbers, MD 04/20/11 [phone]

## 2013-06-13 ENCOUNTER — Emergency Department (HOSPITAL_COMMUNITY)
Admission: EM | Admit: 2013-06-13 | Discharge: 2013-06-13 | Disposition: A | Discharge status: Home / Self Care | Payer: Medicare Other | Attending: Emergency Medicine | Admitting: Emergency Medicine

## 2013-06-13 ENCOUNTER — Encounter (HOSPITAL_COMMUNITY): Payer: Self-pay | Admitting: Emergency Medicine

## 2013-06-13 DIAGNOSIS — R040 Epistaxis: Secondary | ICD-10-CM | POA: Insufficient documentation

## 2013-06-13 DIAGNOSIS — J069 Acute upper respiratory infection, unspecified: Secondary | ICD-10-CM | POA: Diagnosis not present

## 2013-06-13 DIAGNOSIS — I10 Essential (primary) hypertension: Secondary | ICD-10-CM | POA: Diagnosis not present

## 2013-06-13 DIAGNOSIS — Z79899 Other long term (current) drug therapy: Secondary | ICD-10-CM | POA: Insufficient documentation

## 2013-06-13 DIAGNOSIS — D649 Anemia, unspecified: Secondary | ICD-10-CM | POA: Diagnosis not present

## 2013-06-13 LAB — POCT I-STAT, CHEM 8
BUN: 20 mg/dL (ref 6–23)
CREATININE: 1 mg/dL (ref 0.50–1.10)
Calcium, Ion: 1.25 mmol/L — ABNORMAL HIGH (ref 1.12–1.23)
Chloride: 99 mEq/L (ref 96–112)
GLUCOSE: 133 mg/dL — AB (ref 70–99)
HEMATOCRIT: 42 % (ref 36.0–46.0)
HEMOGLOBIN: 14.3 g/dL (ref 12.0–15.0)
POTASSIUM: 3.5 meq/L — AB (ref 3.7–5.3)
SODIUM: 140 meq/L (ref 137–147)
TCO2: 26 mmol/L (ref 0–100)

## 2013-06-13 MED ORDER — OXYMETAZOLINE HCL 0.05 % NA SOLN
1.0000 | Freq: Once | NASAL | Status: AC
  Administered 2013-06-13: 1 via NASAL
  Filled 2013-06-13: qty 15

## 2013-06-13 NOTE — ED Notes (Signed)
Pt states she woke up this morning at 0330 with a nosebleed with clots and got it to stop then went back to bed. After waking up started bleeding again in which she got it to stop but wanted to come in to be checked out. Bleeding was from the left nare.

## 2013-06-13 NOTE — ED Notes (Signed)
Abigail, PA at the bedside.  

## 2013-06-13 NOTE — ED Provider Notes (Signed)
CSN: [id number]     Arrival date & time 06/13/13  [id number] History   None    No chief complaint on file.    (Consider location/radiation/quality/duration/timing/severity/associated sxs/prior Treatment) HPI This is a 58 year old female with a past medical history of hypertension, anemia, previous admission in 2012 for anemia and epistaxis.  Patient presents today complaining of epistaxis.  She states she awoke from sleep at 3:30 AM with bleeding and clots from her left nostril your patient states he was also running down the back of her throat.  The patient applied pressure to the nose for approximately 40 seconds with resolution.  The patient was able to get back to sleep.  She woke at 5:30 AM with bleeding from the nose again.  She can help pressure and was able to resolve bleeding.  Patient does take an aspirin daily but no other anticoagulants.  She complains of sinus pressure, rhinorrhea for the past several days.  The patient denies any cough, fever, neck pain, headaches, rash, sore throat.  Past Medical History  Diagnosis Date  . Hypertension   . Anemia    Past Surgical History  Procedure Laterality Date  . Tubal ligation    . Abdominal hysterectomy     No family history on file. History  Substance Use Topics  . Smoking status: Never Smoker   . Smokeless tobacco: Not on file  . Alcohol Use: No   OB History   Grav Para Term Preterm Abortions TAB SAB Ect Mult Living                 Review of Systems   Ten systems reviewed and are negative for acute change, except as noted in the HPI.   Allergies  Review of patient's allergies indicates no known allergies.  Home Medications   Current Outpatient Rx  Name  Route  Sig  Dispense  Refill  . ferrous sulfate 325 (65 FE) MG tablet   Oral   Take 325 mg by mouth daily with breakfast.           . hydrochlorothiazide (HYDRODIURIL) 25 MG tablet   Oral   Take 25 mg by mouth daily.           Marland Kitchen. lisinopril (PRINIVIL,ZESTRIL) 40  MG tablet   Oral   Take 40 mg by mouth daily.           Marland Kitchen. loratadine (CLARITIN) 10 MG tablet   Oral   Take 10 mg by mouth daily as needed. As needed for seasonal allergies.          . vitamin C (ASCORBIC ACID) 500 MG tablet   Oral   Take 500 mg by mouth daily.            There were no vitals taken for this visit. Physical Exam  Constitutional: She is oriented to person, place, and time. She appears well-developed and well-nourished. No distress.  HENT:  Head: Normocephalic and atraumatic.  Nose: Mucosal edema present.    Eyes: Conjunctivae are normal. No scleral icterus.  Neck: Normal range of motion.  Cardiovascular: Normal rate, regular rhythm and normal heart sounds.  Exam reveals no gallop and no friction rub.   No murmur heard. Pulmonary/Chest: Effort normal and breath sounds normal. No respiratory distress.  Abdominal: Soft. Bowel sounds are normal. She exhibits no distension and no mass. There is no tenderness. There is no guarding.  Neurological: She is alert and oriented to person, place, and time.  Skin: Skin  is warm and dry. She is not diaphoretic.  Psychiatric: Her behavior is normal.    ED Course  EPISTAXIS MANAGEMENT Date/Time: 06/13/2013 5:19 PM Performed by: Arthor Captain Authorized by: Arthor Captain Consent: Verbal consent obtained. Risks and benefits: risks, benefits and alternatives were discussed Consent given by: patient Patient sedated: no Treatment site: left anterior and left Kiesselbach's area Repair method: silver nitrate Post-procedure assessment: bleeding stopped Treatment complexity: simple Patient tolerance: Patient tolerated the procedure well with no immediate complications.   (including critical care time) Labs Review Labs Reviewed - No data to display Imaging Review No results found.  EKG Interpretation   None       MDM   Final diagnoses:  Anterior epistaxis  URI (upper respiratory infection)    Patient here  with epistaxis.  It is controlled at this time.  Appears to be an anterior bleed.  Patient is given Afrin soaked gauze to place in the nasal cavity and hold pressure for approximately 20 minutes.  Nothing to cause constriction of the small arteries in Kiesselbach plexus.  Chem-8 for hemoglobin check is pending.  Hgb normal. Cauter with silver nitrate.  Discharge home with care instructions/prevention. Follow up with ENT .  Return precautions discussed.     Arthor Captain, PA-C 06/13/13 1720

## 2013-06-13 NOTE — ED Notes (Addendum)
Pt undressed, in gown, continuous bp cuff and pulse ox, family at bedside 

## 2013-06-13 NOTE — ED Notes (Signed)
Dr. Zavitz at the bedside.  

## 2013-06-13 NOTE — ED Provider Notes (Signed)
.  Medical screening examination/treatment/procedure(s) were conducted as a shared visit with non-physician practitioner(s) or resident and myself. I personally evaluated the patient during the encounter and agree with the findings and plan unless otherwise indicated.  I have personally reviewed any xrays and/ or EKG's with the provider and I agree with interpretation.  Intermittent nose bleed since 330 am, DM hx, similar to previous, pt has had sinus cold recently. No injuries, blood thinners or bleeding disorders, no presyncope or syncope. Improved on exam in ED. Pt has packing with afrin currently. Will reassess. Pt low risk by history, well appearing. Normal hr, mmm. Plan for outpatient fup. Discussed firm pressure and proper technique.  Rechecked bleeding stopped.  Small area of blood on medial aspect of left nare,  I was present for packing and  Silver nitrate application Epistaxis   Enid SkeensJoshua M Jearldine Cassady, MD 06/13/13 1727

## 2013-06-13 NOTE — Discharge Instructions (Signed)
Nosebleed Nosebleeds can be caused by many conditions including trauma, infections, polyps, foreign bodies, dry mucous membranes or climate, medications and air conditioning. Most nosebleeds occur in the front of the nose. It is because of this location that most nosebleeds can be controlled by pinching the nostrils gently and continuously. Do this for at least 10 to 20 minutes. The reason for this long continuous pressure is that you must hold it long enough for the blood to clot. If during that 10 to 20 minute time period, pressure is released, the process may have to be started again. The nosebleed may stop by itself, quit with pressure, need concentrated heating (cautery) or stop with pressure from packing. HOME CARE INSTRUCTIONS    Avoid blowing your nose for 12 hours after treatment. This could dislodge the pack or clot and start bleeding again.  If the bleeding starts again, sit up and bending forward, gently pinch the front half of your nose continuously for 20 minutes.  If bleeding was caused by dry mucous membranes, cover the inside of your nose every morning with a petroleum or antibiotic ointment. Use your little fingertip as an applicator. Do this as needed during dry weather. This will keep the mucous membranes moist and allow them to heal.  Maintain humidity in your home by using less air conditioning or using a humidifier.  Do not use aspirin or medications which make bleeding more likely. -Please consult with your primary care doctor.   Resume normal activities as able but try to avoid straining, lifting or bending at the waist for several days.  If the nosebleeds become recurrent and the cause is unknown, your caregiver may suggest laboratory tests. SEEK IMMEDIATE MEDICAL CARE IF:   Bleeding recurs and cannot be controlled.  There is unusual bleeding from or bruising on other parts of the body.  You have a fever.  Nosebleeds continue.  There is any worsening of the  condition which originally brought you in.  You become lightheaded, feel faint, become sweaty or vomit blood. MAKE SURE YOU:   Understand these instructions.  Will watch your condition.  Will get help right away if you are not doing well or get worse. Document Released: 01/25/2005 Document Revised: 07/10/2011 Document Reviewed: 03/19/2009 [facility] Patient Information 2014 [place], [place].

## 2015-07-20 ENCOUNTER — Encounter: Payer: Self-pay | Admitting: Family Medicine

## 2015-07-20 ENCOUNTER — Ambulatory Visit (INDEPENDENT_AMBULATORY_CARE_PROVIDER_SITE_OTHER): Payer: Medicare Other | Admitting: Family Medicine

## 2015-07-20 VITALS — BP 142/80 | HR 54 | Temp 97.6°F | Ht 61.0 in | Wt 185.0 lb

## 2015-07-20 DIAGNOSIS — Z862 Personal history of diseases of the blood and blood-forming organs and certain disorders involving the immune mechanism: Secondary | ICD-10-CM

## 2015-07-20 DIAGNOSIS — Z1159 Encounter for screening for other viral diseases: Secondary | ICD-10-CM | POA: Diagnosis not present

## 2015-07-20 DIAGNOSIS — Z114 Encounter for screening for human immunodeficiency virus [HIV]: Secondary | ICD-10-CM | POA: Diagnosis not present

## 2015-07-20 DIAGNOSIS — I10 Essential (primary) hypertension: Secondary | ICD-10-CM | POA: Diagnosis not present

## 2015-07-20 DIAGNOSIS — E785 Hyperlipidemia, unspecified: Secondary | ICD-10-CM | POA: Diagnosis not present

## 2015-07-20 DIAGNOSIS — D649 Anemia, unspecified: Secondary | ICD-10-CM

## 2015-07-20 DIAGNOSIS — E119 Type 2 diabetes mellitus without complications: Secondary | ICD-10-CM

## 2015-07-20 MED ORDER — HYDROCHLOROTHIAZIDE 25 MG PO TABS: 25.0000 mg | ORAL_TABLET | Freq: Every day | ORAL | Status: DC

## 2015-07-20 MED ORDER — LISINOPRIL 40 MG PO TABS: 40.0000 mg | ORAL_TABLET | Freq: Every day | ORAL | Status: DC

## 2015-07-20 NOTE — Patient Instructions (Signed)
It was nice to meet you today!  Schedule a visit for a complete physical. We can update your pap smear and other health maintenance items then. On your way out, schedule an appointment one morning to come back for fasting labs. Do not eat or drink anything other than water the morning of your lab appointment until after your labs are drawn.   Sent in a month of your blood pressure medications.  Be well, Dr. Pollie MeyerMcIntyre

## 2015-07-20 NOTE — Progress Notes (Addendum)
  HPI:  Patient presents today for a new patient appointment to establish general primary care, also to discuss medication refills.  [facility], last seen in [facility]. Stopped being seen there as she wasn't satisfied with the care. Then followed up with [facility] but was dismissed due to missing too many appts.   Needs refills of hypertension medications (HCTZ, lisinopril). Denies chest pain or shortness of breath.  Patient reports mom died 1 year ago. Still grieving this. Doesn't cry much about it but tearful today. Denies SI/HI.    ROS: See HPI  Past Medical Hx:  -anemia - takes iron daily -arthritis -hypertension -heart murmur - saw heart specialist at some point -diabetes-  Treated in past for diabetes with metformin but reports being told she didn't need this anymore -no prior hospitalizations  Current medications: lisinopril 40mg  daily, HCTZ 25mg  daily, iron 65mg  daily, vitamin C 500mg  daily, claritin as needed   Past Surgical Hx:  -D&C in 1996  Obstetrical History: G2P2002. No periods in years. Previously had heavy periods, reason for D&C in 1996  Family Hx: updated in Epic  Social Hx: works part time at [employer]. Lives with 58 year old daughter and 58 yo son. Takes bus to appts, son also drives her. Walks every day for exercise. Denies tobacco, drug, alcohol use. Denies being at risk for STD. Denies depressive symptoms.   Health Maintenance:  -no history of colonoscopy -no pap in years -thinks last mammo in 2015  PHYSICAL EXAM: BP 142/80 mmHg  Pulse 54  Temp(Src) 97.6 F (36.4 C) (Oral)  Ht 5\' 1"  (1.549 m)  Wt 185 lb (83.915 kg)  BMI 34.97 kg/m2  LMP 04/09/2011 Gen: NAD, pleasant, cooperative HEENT: ncat Heart: regular rate and rhythm, 2/6 systolic murmur loudest at LUSB Lungs: clear to auscultation bilaterally, normal work of breathing  Abdomen: soft nttp Neuro:  Alert, grossly nonfocal, speech normal Extremity: No appreciable lower  extremity edema bilaterally   ASSESSMENT/PLAN:  Health maintenance:  -schedule complete physical to update health maintenance items -check HIV & hep C antibodies with labs for routine screening   Essential hypertension, benign Relatively controlled today. Could be better but will hold on changes to medications until have assessment of renal function.  Refill 1 month of antihypertensives Schedule lab visit for fasting labs (A1c, CMET, lipids)  Anemia Check CBC with labs to establish current Hgb  Diabetes (HCC) History unclear - at one point was on metformin Await A1c to further clarify current glycemic control - will obtain at lab visit  FOLLOW UP: Follow up in 1 month for physical Schedule fasting lab appointment   GrenadaBrittany J. Pollie MeyerMcIntyre, MD [facility]

## 2015-07-23 ENCOUNTER — Encounter: Payer: Self-pay | Admitting: Family Medicine

## 2015-07-23 DIAGNOSIS — D649 Anemia, unspecified: Secondary | ICD-10-CM | POA: Insufficient documentation

## 2015-07-23 DIAGNOSIS — E119 Type 2 diabetes mellitus without complications: Secondary | ICD-10-CM | POA: Insufficient documentation

## 2015-07-23 DIAGNOSIS — I10 Essential (primary) hypertension: Secondary | ICD-10-CM | POA: Insufficient documentation

## 2015-07-23 NOTE — Assessment & Plan Note (Signed)
History unclear - at one point was on metformin Await A1c to further clarify current glycemic control - will obtain at lab visit

## 2015-07-23 NOTE — Assessment & Plan Note (Signed)
Check CBC with labs to establish current Hgb

## 2015-07-23 NOTE — Assessment & Plan Note (Signed)
Relatively controlled today. Could be better but will hold on changes to medications until have assessment of renal function.  Refill 1 month of antihypertensives Schedule lab visit for fasting labs (A1c, CMET, lipids)

## 2015-07-27 ENCOUNTER — Other Ambulatory Visit (INDEPENDENT_AMBULATORY_CARE_PROVIDER_SITE_OTHER): Payer: Medicare Other

## 2015-07-27 ENCOUNTER — Other Ambulatory Visit: Payer: Medicare Other

## 2015-07-27 DIAGNOSIS — Z862 Personal history of diseases of the blood and blood-forming organs and certain disorders involving the immune mechanism: Secondary | ICD-10-CM | POA: Diagnosis not present

## 2015-07-27 DIAGNOSIS — Z114 Encounter for screening for human immunodeficiency virus [HIV]: Secondary | ICD-10-CM

## 2015-07-27 DIAGNOSIS — Z1159 Encounter for screening for other viral diseases: Secondary | ICD-10-CM

## 2015-07-27 DIAGNOSIS — E785 Hyperlipidemia, unspecified: Secondary | ICD-10-CM

## 2015-07-27 DIAGNOSIS — E119 Type 2 diabetes mellitus without complications: Secondary | ICD-10-CM

## 2015-07-27 LAB — CBC
HCT: 36.3 % (ref 36.0–46.0)
Hemoglobin: 11.4 g/dL — ABNORMAL LOW (ref 12.0–15.0)
MCH: 23 pg — AB (ref 26.0–34.0)
MCHC: 31.4 g/dL (ref 30.0–36.0)
MCV: 73.3 fL — AB (ref 78.0–100.0)
MPV: 10.3 fL (ref 8.6–12.4)
Platelets: 250 10*3/uL (ref 150–400)
RBC: 4.95 MIL/uL (ref 3.87–5.11)
RDW: 17.1 % — ABNORMAL HIGH (ref 11.5–15.5)
WBC: 3.3 10*3/uL — ABNORMAL LOW (ref 4.0–10.5)

## 2015-07-27 LAB — HEPATITIS C ANTIBODY: HCV AB: NEGATIVE

## 2015-07-27 LAB — POCT GLYCOSYLATED HEMOGLOBIN (HGB A1C): Hemoglobin A1C: 5.3

## 2015-07-27 NOTE — Progress Notes (Signed)
Labs done today [name] 

## 2015-07-28 LAB — COMPLETE METABOLIC PANEL WITH GFR
ALT: 23 U/L (ref 6–29)
AST: 30 U/L (ref 10–35)
Albumin: 4.1 g/dL (ref 3.6–5.1)
Alkaline Phosphatase: 66 U/L (ref 33–130)
BUN: 24 mg/dL (ref 7–25)
CALCIUM: 9.5 mg/dL (ref 8.6–10.4)
CHLORIDE: 102 mmol/L (ref 98–110)
CO2: 28 mmol/L (ref 20–31)
Creat: 1.06 mg/dL — ABNORMAL HIGH (ref 0.50–1.05)
GFR, EST AFRICAN AMERICAN: 67 mL/min (ref >=60)
GFR, EST NON AFRICAN AMERICAN: 58 mL/min — AB (ref >=60)
Glucose, Bld: 94 mg/dL (ref 65–99)
POTASSIUM: 4.2 mmol/L (ref 3.5–5.3)
Sodium: 139 mmol/L (ref 135–146)
Total Bilirubin: 0.5 mg/dL (ref 0.2–1.2)
Total Protein: 6.7 g/dL (ref 6.1–8.1)

## 2015-07-28 LAB — LIPID PANEL
CHOL/HDL RATIO: 5.1 ratio — AB (ref <=5.0)
CHOLESTEROL: 267 mg/dL — AB (ref 125–200)
HDL: 52 mg/dL (ref >=46)
LDL Cholesterol: 197 mg/dL — ABNORMAL HIGH (ref <130)
TRIGLYCERIDES: 89 mg/dL (ref <150)
VLDL: 18 mg/dL (ref <30)

## 2015-07-28 LAB — HIV ANTIBODY (ROUTINE TESTING W REFLEX): HIV: NONREACTIVE

## 2015-08-05 ENCOUNTER — Encounter: Payer: Self-pay | Admitting: Family Medicine

## 2015-08-05 DIAGNOSIS — E785 Hyperlipidemia, unspecified: Secondary | ICD-10-CM | POA: Insufficient documentation

## 2015-08-17 ENCOUNTER — Encounter: Payer: Medicare Other | Admitting: Family Medicine

## 2015-08-23 ENCOUNTER — Encounter: Payer: Medicare Other | Admitting: Family Medicine

## 2015-09-04 ENCOUNTER — Other Ambulatory Visit: Payer: Self-pay | Admitting: Family Medicine

## 2015-09-06 NOTE — Telephone Encounter (Signed)
Recent CMEt unremarkable Ok 3 m refill

## 2015-10-05 ENCOUNTER — Encounter: Payer: Medicare Other | Admitting: Family Medicine

## 2015-12-18 DIAGNOSIS — I10 Essential (primary) hypertension: Secondary | ICD-10-CM | POA: Diagnosis not present

## 2015-12-18 DIAGNOSIS — M1990 Unspecified osteoarthritis, unspecified site: Secondary | ICD-10-CM | POA: Diagnosis not present

## 2015-12-18 DIAGNOSIS — E785 Hyperlipidemia, unspecified: Secondary | ICD-10-CM | POA: Diagnosis not present

## 2015-12-18 DIAGNOSIS — Z79899 Other long term (current) drug therapy: Secondary | ICD-10-CM | POA: Diagnosis not present

## 2015-12-18 DIAGNOSIS — R05 Cough: Secondary | ICD-10-CM | POA: Diagnosis not present

## 2015-12-18 DIAGNOSIS — H9201 Otalgia, right ear: Secondary | ICD-10-CM | POA: Diagnosis not present

## 2016-01-09 ENCOUNTER — Other Ambulatory Visit: Payer: Self-pay | Admitting: Family Medicine

## 2016-01-11 DIAGNOSIS — J029 Acute pharyngitis, unspecified: Secondary | ICD-10-CM | POA: Diagnosis not present

## 2016-01-11 DIAGNOSIS — H9203 Otalgia, bilateral: Secondary | ICD-10-CM | POA: Diagnosis not present

## 2016-01-11 DIAGNOSIS — H938X9 Other specified disorders of ear, unspecified ear: Secondary | ICD-10-CM | POA: Diagnosis not present

## 2016-01-12 ENCOUNTER — Other Ambulatory Visit: Payer: Self-pay | Admitting: *Deleted

## 2016-01-12 MED ORDER — LISINOPRIL 40 MG PO TABS: 40.0000 mg | ORAL_TABLET | Freq: Every day | ORAL | 3 refills | Status: DC

## 2016-01-12 MED ORDER — HYDROCHLOROTHIAZIDE 25 MG PO TABS: ORAL_TABLET | ORAL | 3 refills | Status: DC

## 2016-01-16 DIAGNOSIS — J069 Acute upper respiratory infection, unspecified: Secondary | ICD-10-CM | POA: Diagnosis not present

## 2016-01-16 DIAGNOSIS — I10 Essential (primary) hypertension: Secondary | ICD-10-CM | POA: Diagnosis not present

## 2016-01-16 DIAGNOSIS — Z760 Encounter for issue of repeat prescription: Secondary | ICD-10-CM | POA: Diagnosis not present

## 2016-02-22 ENCOUNTER — Telehealth: Payer: Self-pay | Admitting: Family Medicine

## 2016-02-22 NOTE — Telephone Encounter (Signed)
Pt noticed yesterday that after she took her blood pressure and fluid pill, she started feeling jittery.  She has noticed the jittery feeling for a few weeks and she thought it could be related to coffee drinking or something at work. Please advise

## 2016-02-24 NOTE — Telephone Encounter (Signed)
Patient informed of message from MD, appointment scheduled for 10/31.

## 2016-02-24 NOTE — Telephone Encounter (Signed)
Recommend patient be seen for a visit. I have only seen her once, back in March when she established care. Need her to come in for visit to follow up on abnormal labwork from that visit (I had sent her a letter).  We can check blood pressure and see how the jitteriness is doing at that visit.  Thanks Latrelle DodrillBrittany J Shae Hinnenkamp, MD

## 2016-02-29 ENCOUNTER — Ambulatory Visit: Payer: Medicare Other | Admitting: Family Medicine

## 2016-03-07 ENCOUNTER — Ambulatory Visit (INDEPENDENT_AMBULATORY_CARE_PROVIDER_SITE_OTHER): Payer: Medicare Other | Admitting: Family Medicine

## 2016-03-07 VITALS — BP 129/70 | HR 77 | Temp 99.1°F | Ht 61.0 in | Wt 190.8 lb

## 2016-03-07 DIAGNOSIS — D72819 Decreased white blood cell count, unspecified: Secondary | ICD-10-CM | POA: Diagnosis not present

## 2016-03-07 DIAGNOSIS — I10 Essential (primary) hypertension: Secondary | ICD-10-CM

## 2016-03-07 DIAGNOSIS — E782 Mixed hyperlipidemia: Secondary | ICD-10-CM

## 2016-03-07 DIAGNOSIS — Z23 Encounter for immunization: Secondary | ICD-10-CM | POA: Diagnosis not present

## 2016-03-07 DIAGNOSIS — E119 Type 2 diabetes mellitus without complications: Secondary | ICD-10-CM | POA: Diagnosis not present

## 2016-03-07 DIAGNOSIS — R450 Nervousness: Secondary | ICD-10-CM

## 2016-03-07 MED ORDER — ATORVASTATIN CALCIUM 40 MG PO TABS: 40.0000 mg | ORAL_TABLET | Freq: Every day | ORAL | 0 refills | Status: DC

## 2016-03-07 MED ORDER — LISINOPRIL 20 MG PO TABS: 20.0000 mg | ORAL_TABLET | Freq: Every day | ORAL | 0 refills | Status: DC

## 2016-03-07 NOTE — Assessment & Plan Note (Signed)
WBC slightly low at 3.3 at last visit. - CBC with diff today

## 2016-03-07 NOTE — Assessment & Plan Note (Signed)
ASCVD score 10.5% (if you do not count the patient's reported history of DM previously on metformin).  - will start statin today.

## 2016-03-07 NOTE — Patient Instructions (Addendum)
It was nice to meet you. Since you've been taking half of the lisinopril and her blood pressure looks normal today, we can continue with lisinopril 20 mg daily. Can continue to cut the lisinopril tablets you have in half until they are completed. I have prescribed you another lisinopril 20 mg tablet, you will just take a full tablet of this. Continue to take the hydrochlorothiazide 25 mg tablet by mouth daily. Because your cholesterol was high and we would like to reduce your risk of heart attack and stroke, I have added a medication called Lipitor. Continue to avoid caffeine, as I feel this is the cause of your jitteriness. I will check your thyroid function, just to make sure that this is not what is causing your jitteriness.  I will check your white blood cell count, just to make sure that it is improving.   You received the flu vaccine today.

## 2016-03-07 NOTE — Progress Notes (Addendum)
Subjective: CC: f/u abnormal labs and jitteriness HPI: Patient is a 60 y.o. female with a past medical history of HTN presenting to clinic today for abnormal labwork and jitteriness and patient notes a nurse called her about low iron.  Jitteriness: has been occurring for several months. Feels jittery in her arms and hands. Sometimes in her legs as well. She started to cut  her BP pills (lisinopril) in half and feels like her jitteriness has improved. She drank 1 cup of coffee per day, she stopped drinking coffee in October. No change in skin. Is more diaphoretic due to menopause. No chest palpitations, chest pain, or SOB.  Never hand anything like this before.   Abnormal labs: patients LDL high. States she's been on a cholesterol medication in the past without issues. Patient also worried about her iron which she states a nurse told her was low over the phone (wasn't checked).   HTN: doesn't check BPs at home. Feels she's been doing well with her medications besides thinking the lisinopril caused her jitteriness. No dizziness, chest pain, SOB, or swelling.    Social History: never smoker   Health Maintenance: patient due to flu vaccine, no DM noted on recent check, will hold off on pneumococcal vaccine, etc. Advised she should f/u with her PCP in 2 months for pap smear, etc.   ROS: All other systems reviewed and are negative.  Past Medical History Patient Active Problem List   Diagnosis Date Noted  . Jittery 03/08/2016  . Leukopenia 03/07/2016  . Hyperlipidemia 08/05/2015  . Essential hypertension, benign 07/23/2015  . Diabetes (HCC) 07/23/2015  . Anemia 07/23/2015    Medications- reviewed and updated  Objective: Office vital signs reviewed. BP 129/70   Pulse 77   Temp 99.1 F (37.3 C) (Oral)   Ht 5\' 1"  (1.549 m)   Wt 190 lb 12.8 oz (86.5 kg)   LMP 04/09/2011   BMI 36.05 kg/m    Physical Examination:  General: Awake, alert, well- nourished, NAD ENMT:  TMs intact,  normal light reflex, no erythema, no bulging. Nasal turbinates moist. MMM, Oropharynx clear without erythema or tonsillar exudate/hypertrophy Eyes: Conjunctiva non-injected. PERRL.  Neck: no LAD, no thyromegaly noted.  Cardio: RRR, 3/6 systolic murmur.  Pulm: No increased WOB.  CTAB, without wheezes, rhonchi or crackles noted.  MSK: Normal gait and station Skin: dry, intact, no rashes or lesions Neuro: no tremors noted. 5/5 strength in the LE and UE bilaterally.  Assessment/Plan: Hyperlipidemia ASCVD score 10.5% (if you do not count the patient's reported history of DM previously on metformin).  - will start statin today.  Diabetes (HCC) A1c 5.3 on visit to establish care. Foot exam and pneumococcal vaccine deferred given no evidence of DM.   Leukopenia WBC slightly low at 3.3 at last visit. - CBC with diff today  Essential hypertension, benign BP at goal today despite the patient stating she only has been taking lisinopril 20mg  and HCTZ 25mg  daily.  - will decrease lisinopril to 20mg  daily (as this is what she's been taking). - continue HCTZ - f/u with PCP in 3 months for f/u and pap smear. - BMET today  Jittery Discussed that I felt her improvement was most likely 2/2 abstaining from caffeine and not the anti-hypertensives. Not noted on exam today. No red flags to suggest this is cardiac in nature.  - TSH today - continue to monitor.   Health maintenance: flu vaccine today.  Orders Placed This Encounter  Procedures  .  Flu Vaccine QUAD 36+ mos IM  . BASIC METABOLIC PANEL WITH GFR  . CBC with Differential/Platelet  . TSH    Meds ordered this encounter  Medications  . atorvastatin (LIPITOR) 40 MG tablet    Sig: Take 1 tablet (40 mg total) by mouth daily.    Dispense:  90 tablet    Refill:  0  . lisinopril (PRINIVIL,ZESTRIL) 20 MG tablet    Sig: Take 1 tablet (20 mg total) by mouth daily.    Dispense:  90 tablet    Refill:  0    [name] PGY-3, [facility]  [facility] Medicine

## 2016-03-07 NOTE — Assessment & Plan Note (Addendum)
A1c 5.3 on visit to establish care. Foot exam and pneumococcal vaccine deferred given no evidence of DM.

## 2016-03-08 DIAGNOSIS — R450 Nervousness: Secondary | ICD-10-CM | POA: Insufficient documentation

## 2016-03-08 LAB — BASIC METABOLIC PANEL WITH GFR
BUN: 20 mg/dL (ref 7–25)
CHLORIDE: 103 mmol/L (ref 98–110)
CO2: 29 mmol/L (ref 20–31)
Calcium: 10.1 mg/dL (ref 8.6–10.4)
Creat: 0.97 mg/dL (ref 0.50–1.05)
GFR, EST AFRICAN AMERICAN: 75 mL/min (ref >=60)
GFR, EST NON AFRICAN AMERICAN: 65 mL/min (ref >=60)
GLUCOSE: 89 mg/dL (ref 65–99)
POTASSIUM: 4.4 mmol/L (ref 3.5–5.3)
Sodium: 142 mmol/L (ref 135–146)

## 2016-03-08 LAB — CBC WITH DIFFERENTIAL/PLATELET
BASOS ABS: 44 {cells}/uL (ref 0–200)
Basophils Relative: 1 %
EOS ABS: 44 {cells}/uL (ref 15–500)
EOS PCT: 1 %
HEMATOCRIT: 35.8 % (ref 35.0–45.0)
HEMOGLOBIN: 11.2 g/dL — AB (ref 11.7–15.5)
LYMPHS ABS: 1452 {cells}/uL (ref 850–3900)
Lymphocytes Relative: 33 %
MCH: 23.6 pg — AB (ref 27.0–33.0)
MCHC: 31.3 g/dL — ABNORMAL LOW (ref 32.0–36.0)
MCV: 75.5 fL — ABNORMAL LOW (ref 80.0–100.0)
MONO ABS: 396 {cells}/uL (ref 200–950)
MPV: 9.8 fL (ref 7.5–12.5)
Monocytes Relative: 9 %
NEUTROS ABS: 2464 {cells}/uL (ref 1500–7800)
Neutrophils Relative %: 56 %
Platelets: 241 10*3/uL (ref 140–400)
RBC: 4.74 MIL/uL (ref 3.80–5.10)
RDW: 17 % — ABNORMAL HIGH (ref 11.0–15.0)
WBC: 4.4 10*3/uL (ref 3.8–10.8)

## 2016-03-08 LAB — TSH: TSH: 1.07 m[IU]/L

## 2016-03-08 NOTE — Assessment & Plan Note (Signed)
Discussed that I felt her improvement was most likely 2/2 abstaining from caffeine and not the anti-hypertensives. Not noted on exam today. No red flags to suggest this is cardiac in nature.  - TSH today - continue to monitor.

## 2016-03-08 NOTE — Assessment & Plan Note (Signed)
BP at goal today despite the patient stating she only has been taking lisinopril 20mg  and HCTZ 25mg  daily.  - will decrease lisinopril to 20mg  daily (as this is what she's been taking). - continue HCTZ - f/u with PCP in 3 months for f/u and pap smear. - BMET today

## 2016-03-17 ENCOUNTER — Encounter (HOSPITAL_COMMUNITY): Payer: Self-pay | Admitting: Family Medicine

## 2016-04-22 ENCOUNTER — Other Ambulatory Visit: Payer: Self-pay | Admitting: Family Medicine

## 2016-06-05 ENCOUNTER — Other Ambulatory Visit: Payer: Self-pay | Admitting: Family Medicine

## 2016-07-03 ENCOUNTER — Other Ambulatory Visit: Payer: Self-pay | Admitting: Family Medicine

## 2016-07-27 ENCOUNTER — Other Ambulatory Visit: Payer: Self-pay | Admitting: Family Medicine

## 2016-12-29 ENCOUNTER — Other Ambulatory Visit: Payer: Self-pay | Admitting: Family Medicine
